# Patient Record
Sex: Female | Born: 1980 | Race: White | Hispanic: No | Marital: Married | Smoking: Never smoker
Health system: Southern US, Community
[De-identification: ages and names within clinical notes are randomized; demographics above are authoritative.]

## PROBLEM LIST (undated history)

## (undated) DIAGNOSIS — G43909 Migraine, unspecified, not intractable, without status migrainosus: Secondary | ICD-10-CM

## (undated) DIAGNOSIS — F329 Major depressive disorder, single episode, unspecified: Secondary | ICD-10-CM

## (undated) DIAGNOSIS — F32A Depression, unspecified: Secondary | ICD-10-CM

---

## 2018-02-28 ENCOUNTER — Encounter: Payer: Self-pay | Admitting: Emergency Medicine

## 2018-02-28 ENCOUNTER — Emergency Department (INDEPENDENT_AMBULATORY_CARE_PROVIDER_SITE_OTHER)
Admission: EM | Admit: 2018-02-28 | Discharge: 2018-02-28 | Disposition: A | Discharge status: Home / Self Care | Payer: 59 | Source: Home / Self Care | Attending: Family Medicine | Admitting: Family Medicine

## 2018-02-28 DIAGNOSIS — M542 Cervicalgia: Secondary | ICD-10-CM

## 2018-02-28 DIAGNOSIS — G43909 Migraine, unspecified, not intractable, without status migrainosus: Secondary | ICD-10-CM

## 2018-02-28 HISTORY — DX: Migraine, unspecified, not intractable, without status migrainosus: G43.909

## 2018-02-28 HISTORY — DX: Major depressive disorder, single episode, unspecified: F32.9

## 2018-02-28 HISTORY — DX: Depression, unspecified: F32.A

## 2018-02-28 MED ORDER — DEXAMETHASONE SODIUM PHOSPHATE 10 MG/ML IJ SOLN
10.0000 mg | Freq: Once | INTRAMUSCULAR | Status: AC
  Administered 2018-02-28: 10 mg via INTRAMUSCULAR

## 2018-02-28 MED ORDER — METOCLOPRAMIDE HCL 5 MG/ML IJ SOLN
5.0000 mg | Freq: Once | INTRAMUSCULAR | Status: AC
  Administered 2018-02-28: 5 mg via INTRAMUSCULAR

## 2018-02-28 MED ORDER — KETOROLAC TROMETHAMINE 60 MG/2ML IM SOLN
60.0000 mg | Freq: Once | INTRAMUSCULAR | Status: AC
  Administered 2018-02-28: 60 mg via INTRAMUSCULAR

## 2018-02-28 NOTE — ED Provider Notes (Signed)
[facility]    CSN: [id number] Arrival date & time: 02/28/18  1651     History   Chief Complaint Chief Complaint  Patient presents with  . Neck Injury    HPI Julia Benjamin is a 37 y.o. female.   Patient has a history of chronic neck pain, and is scheduled to see her neurologist in one week.  She also has a history of migraine headaches. She developed recurrent posterior neck pain two days ago that triggered a typical migraine headache.  Her headache has not responded to Topamax and Goody's powders.    The history is provided by the patient and the spouse.  Migraine  This is a recurrent problem. The current episode started 2 days ago. The problem occurs constantly. The problem has been gradually worsening. Associated symptoms include headaches. Exacerbated by: bright light. Nothing relieves the symptoms. Treatments tried: Topamax and Goody's powders. The treatment provided no relief.    Past Medical History:  Diagnosis Date  . Depression   . Migraine     There are no active problems to display for this patient.   History reviewed. No pertinent surgical history.  OB History   None      Home Medications    Prior to Admission medications   Medication Sig Start Date End Date Taking? Authorizing Provider  escitalopram (LEXAPRO) 20 MG tablet Take 20 mg by mouth daily.   Yes [provider]  topiramate (TOPAMAX) 25 MG capsule Take 25 mg by mouth 2 (two) times daily.   Yes [provider]    Family History History reviewed. No pertinent family history.  Social History Social History   Tobacco Use  . Smoking status: Never Smoker  . Smokeless tobacco: Never Used  Substance Use Topics  . Alcohol use: Yes  . Drug use: Not Currently     Allergies   Patient has no allergy information on record.   Review of Systems Review of Systems  Constitutional: Positive for activity change and appetite change. Negative for chills,  diaphoresis, fatigue and fever.  HENT: Negative for congestion, facial swelling, rhinorrhea and sinus pressure.   Eyes: Positive for photophobia.  Respiratory: Negative.   Cardiovascular: Negative.   Gastrointestinal: Positive for nausea.  Genitourinary: Negative.   Musculoskeletal: Positive for neck pain.  Skin: Negative.   Neurological: Positive for headaches. Negative for tremors, syncope, facial asymmetry, speech difficulty, weakness and numbness.     Physical Exam Triage Vital Signs ED Triage Vitals  Enc Vitals Group     BP 02/28/18 1722 101/66     Pulse Rate 02/28/18 1722 81     Resp --      Temp 02/28/18 1722 97.6 F (36.4 C)     Temp Source 02/28/18 1722 Oral     SpO2 02/28/18 1722 96 %     Weight 02/28/18 1723 190 lb (86.2 kg)     Height --      Head Circumference --      Peak Flow --      Pain Score 02/28/18 1723 10     Pain Loc --      Pain Edu? --      Excl. in [place]? --    No data found.  Updated Vital Signs BP 101/66 (BP Location: Right Arm)   Pulse 81   Temp 97.6 F (36.4 C) (Oral)   Wt 86.2 kg   LMP 02/14/2018 (Approximate)   SpO2 96%   Visual Acuity Right Eye Distance:  Left Eye Distance:   Bilateral Distance:    Right Eye Near:   Left Eye Near:    Bilateral Near:     Physical Exam Nursing notes and Vital Signs reviewed. Appearance:  Patient is sitting in darkened room.  She appears uncomfortable but in no acute distress Eyes:  Pupils are equal, round, and reactive to light and accomodation.  Extraocular movement is intact.  Conjunctivae are not inflamed.  Fundi benign.  Mild photophobia present.  Ears:  Canals normal.  Tympanic membranes normal.  Nose:   Normal turbinates.  No sinus tenderness.   Pharynx:  Normal Neck:  Supple.  No adenopathy. Posterior trapezius muscle tenderness, extending to occipital area.  Lungs:  Clear to auscultation.  Breath sounds are equal.  Moving air well. Heart:  Regular rate and rhythm without murmurs, rubs,  or gallops.  Abdomen:  Nontender without masses or hepatosplenomegaly.  Bowel sounds are present.  No CVA or flank tenderness.  Extremities:  No edema.  Skin:  No rash present.  Neurologic:  Cranial nerves 2 through 12 are normal.  Patellar, achilles, and elbow reflexes are normal.  Cerebellar function is intact (finger-to-nose and rapid alternating hand movement).  Gait and station are normal.  UC Treatments / Results  Labs (all labs ordered are listed, but only abnormal results are displayed) Labs Reviewed - No data to display  EKG None  Radiology No results found.  Procedures Procedures (including critical care time)  Medications Ordered in UC Medications  ketorolac (TORADOL) injection 60 mg (has no administration in time range)  metoCLOPramide (REGLAN) injection 5 mg (has no administration in time range)  dexamethasone (DECADRON) injection 10 mg (has no administration in time range)    Initial Impression / Assessment and Plan / UC Course  I have reviewed the triage vital signs and the nursing notes.  Pertinent labs & imaging results that were available during my care of the patient were reviewed by me and considered in my medical decision making (see chart for details).    Administered Toradol 60mg  IM Administered Reglan 5mg  IM.  Administered Decadron 10mg  IM.  Followup with neurologist as scheduled   Final Clinical Impressions(s) / UC Diagnoses   Final diagnoses:  Migraine without status migrainosus, not intractable, unspecified migraine type  Neck pain, acute     Discharge Instructions     If symptoms become significantly worse during the night or over the weekend, proceed to the local emergency room.     ED Prescriptions    None        Julia Haw, MD 03/02/18 [phone]

## 2018-02-28 NOTE — Discharge Instructions (Addendum)
If symptoms become significantly worse during the night or over the weekend, proceed to the local emergency room.  

## 2018-02-28 NOTE — ED Triage Notes (Signed)
Pt c/o neck pain and migraine x2 days. States she took topamax and goody powders with no relief.

## 2019-05-13 ENCOUNTER — Encounter: Payer: Self-pay | Admitting: Emergency Medicine

## 2019-05-13 ENCOUNTER — Emergency Department (INDEPENDENT_AMBULATORY_CARE_PROVIDER_SITE_OTHER): Payer: 59

## 2019-05-13 ENCOUNTER — Emergency Department (INDEPENDENT_AMBULATORY_CARE_PROVIDER_SITE_OTHER)
Admission: EM | Admit: 2019-05-13 | Discharge: 2019-05-13 | Disposition: A | Discharge status: Home / Self Care | Payer: 59 | Source: Home / Self Care

## 2019-05-13 ENCOUNTER — Other Ambulatory Visit: Payer: Self-pay

## 2019-05-13 DIAGNOSIS — M25511 Pain in right shoulder: Secondary | ICD-10-CM

## 2019-05-13 DIAGNOSIS — S43421A Sprain of right rotator cuff capsule, initial encounter: Secondary | ICD-10-CM | POA: Diagnosis not present

## 2019-05-13 MED ORDER — HYDROCODONE-ACETAMINOPHEN 5-325 MG PO TABS: ORAL_TABLET | ORAL | 0 refills | Status: AC

## 2019-05-13 NOTE — ED Triage Notes (Addendum)
Patient reports falling down about 6 indoor steps yesterday and catching her right shoulder on railing; has her arm in sling; took ibuprofen 600mg  at 1300.She can move her hand, lower and upper arm with all pain originating in shoulder.  She has no known exposure to covid positive person, nor has she travelled.

## 2019-05-13 NOTE — ED Provider Notes (Signed)
[facility]    CSN: [id number] Arrival date & time: 05/13/19  1407      History   Chief Complaint Chief Complaint  Patient presents with  . Shoulder Pain    HPI Julia Benjamin is a 38 y.o. female.   Patient fell down stairs yesterday.  Her right arm caught on the handrail, resulting in hyper-abduction of her right shoulder.  She has had persistent right shoulder pain, and is unable to abduct her shoulder.  The history is provided by the patient.  Shoulder Injury This is a new problem. The current episode started yesterday. The problem occurs constantly. The problem has not changed since onset.Pertinent negatives include no chest pain. Exacerbated by: right shoulder movement. Nothing relieves the symptoms. Treatments tried: Ibuprofen. The treatment provided no relief.    Past Medical History:  Diagnosis Date  . Depression   . Migraine     There are no active problems to display for this patient.   History reviewed. No pertinent surgical history.  OB History   No obstetric history on file.      Home Medications    Prior to Admission medications   Medication Sig Start Date End Date Taking? Authorizing Provider  escitalopram (LEXAPRO) 20 MG tablet Take 20 mg by mouth daily.    [provider]  HYDROcodone-acetaminophen (NORCO/VICODIN) 5-325 MG tablet Take one by mouth at bedtime as needed for pain 05/13/19   Kandra Nicolas, MD  topiramate (TOPAMAX) 25 MG capsule Take 25 mg by mouth 2 (two) times daily.    [provider]    Family History No family history on file.  Social History Social History   Tobacco Use  . Smoking status: Never Smoker  . Smokeless tobacco: Never Used  Substance Use Topics  . Alcohol use: Yes  . Drug use: Not Currently     Allergies   Patient has no known allergies.   Review of Systems Review of Systems  Cardiovascular: Negative for chest pain.  All other systems reviewed and are negative.     Physical Exam Triage Vital Signs ED Triage Vitals  Enc Vitals Group     BP 05/13/19 1507 (!) 95/59     Pulse Rate 05/13/19 1507 (!) 55     Resp 05/13/19 1507 16     Temp 05/13/19 1507 97.8 F (36.6 C)     Temp Source 05/13/19 1507 Oral     SpO2 05/13/19 1507 96 %     Weight 05/13/19 1508 200 lb (90.7 kg)     Height 05/13/19 1508 5\' 2"  (1.575 m)     Head Circumference --      Peak Flow --      Pain Score 05/13/19 1508 7     Pain Loc --      Pain Edu? --      Excl. in [place]? --    No data found.  Updated Vital Signs BP (!) 95/59 (BP Location: Left Arm)   Pulse (!) 55   Temp 97.8 F (36.6 C) (Oral)   Resp 16   Ht 5\' 2"  (1.575 m)   Wt 90.7 kg   LMP 04/25/2019   SpO2 96%   BMI 36.58 kg/m   Visual Acuity Right Eye Distance:   Left Eye Distance:   Bilateral Distance:    Right Eye Near:   Left Eye Near:    Bilateral Near:     Physical Exam Constitutional:      General: She  is not in acute distress.    Appearance: She is obese.  HENT:     Head: Normocephalic.  Eyes:     Pupils: Pupils are equal, round, and reactive to light.  Cardiovascular:     Heart sounds: Normal heart sounds.  Pulmonary:     Breath sounds: Normal breath sounds.  Musculoskeletal:     Right shoulder: No swelling, deformity, laceration, tenderness, bony tenderness or crepitus. Decreased range of motion. Decreased strength. Normal pulse.     Cervical back: Normal range of motion.     Comments: Right shoulder has decreased internal/external rotation but relatively good strength.  The patient has difficulty actively/passively abducting more than 45 degrees from vertical. Apley's test positive.  Patient unable to perform empty can test.  Distal neurovascular function is intact.    Skin:    General: Skin is warm and dry.  Neurological:     Mental Status: She is alert.      UC Treatments / Results  Labs (all labs ordered are listed, but only abnormal results are displayed) Labs Reviewed - No  data to display  EKG   Radiology Dg Shoulder Right  Result Date: 05/13/2019 CLINICAL DATA:  38 year old female with fall and right shoulder pain. EXAM: RIGHT SHOULDER - 2+ VIEW COMPARISON:  None. FINDINGS: There is no evidence of fracture or dislocation. There is no evidence of arthropathy or other focal bone abnormality. Soft tissues are unremarkable. IMPRESSION: Negative. Electronically Signed   By: Elgie Collard M.D.   On: 05/13/2019 15:43    Procedures Procedures (including critical care time)  Medications Ordered in UC Medications - No data to display  Initial Impression / Assessment and Plan / UC Course  I have reviewed the triage vital signs and the nursing notes.  Pertinent labs & imaging results that were available during my care of the patient were reviewed by me and considered in my medical decision making (see chart for details).    Rx for Lortab at bedtime (#5, no refill). Controlled Substance Prescriptions I have consulted the [place] Controlled Substances Registry for this patient, and feel the risk/benefit ratio today is favorable for proceeding with this prescription for a controlled substance.  Schedule follow-up with Sports Medicine or Orhtopedist.   Final Clinical Impressions(s) / UC Diagnoses   Final diagnoses:  Sprain of right rotator cuff capsule, initial encounter     Discharge Instructions     Wear sling.  Apply ice pack for 20 to 30 minutes, 3 to 4 times daily  Continue until pain and swelling decrease.  May take Aleve, 2 tabs every 12 hours. Begin shoulder exercises as tolerated.   ED Prescriptions    Medication Sig Dispense Auth. Provider   HYDROcodone-acetaminophen (NORCO/VICODIN) 5-325 MG tablet Take one by mouth at bedtime as needed for pain 5 tablet Julia Haw, MD        Julia Haw, MD 05/18/19 [phone]

## 2019-05-13 NOTE — Discharge Instructions (Addendum)
Wear sling.  Apply ice pack for 20 to 30 minutes, 3 to 4 times daily  Continue until pain and swelling decrease.  May take Aleve, 2 tabs every 12 hours. Begin shoulder exercises as tolerated.

## 2019-08-16 ENCOUNTER — Ambulatory Visit: Payer: 59 | Attending: Internal Medicine

## 2019-08-16 DIAGNOSIS — Z23 Encounter for immunization: Secondary | ICD-10-CM

## 2019-08-16 NOTE — Progress Notes (Signed)
   Covid-19 Vaccination Clinic  Name:  Julia Benjamin    MRN: [id number] DOB: December 06, 1980  08/16/2019  Julia Benjamin was observed post Covid-19 immunization for 15 minutes without incident. She was provided with Vaccine Information Sheet and instruction to access the V-Safe system.   Julia Benjamin was instructed to call 911 with any severe reactions post vaccine: Marland Kitchen Difficulty breathing  . Swelling of face and throat  . A fast heartbeat  . A bad rash all over body  . Dizziness and weakness   Immunizations Administered    Name Date Dose VIS Date Route   Pfizer COVID-19 Vaccine 08/16/2019  8:30 AM 0.3 mL 05/18/2019 Intramuscular   Manufacturer: [employer], [employer]   Lot: [id number]   NDC: [address]-1

## 2019-09-10 ENCOUNTER — Ambulatory Visit: Payer: 59 | Attending: Internal Medicine

## 2019-09-10 DIAGNOSIS — Z23 Encounter for immunization: Secondary | ICD-10-CM

## 2019-09-10 NOTE — Progress Notes (Signed)
   Covid-19 Vaccination Clinic  Name:  Galia Rahm    MRN: [id number] DOB: [birth year]  09/10/2019  Ms. Vida was observed post Covid-19 immunization for 15 minutes without incident. She was provided with Vaccine Information Sheet and instruction to access the V-Safe system.   Ms. Robertshaw was instructed to call 911 with any severe reactions post vaccine: Marland Kitchen Difficulty breathing  . Swelling of face and throat  . A fast heartbeat  . A bad rash all over body  . Dizziness and weakness   Immunizations Administered    Name Date Dose VIS Date Route   Pfizer COVID-19 Vaccine 09/10/2019  8:32 AM 0.3 mL 05/18/2019 Intramuscular   Manufacturer: [employer], [employer]   Lot: [id number]   NDC: [address]-5

## 2020-05-20 IMAGING — DX DG SHOULDER 2+V*R*
3 series · 3 of 3 positions shown · non-contrast
Comparison: None.

CLINICAL DATA: 38-year-old female with fall and right shoulder
pain.

EXAM:
RIGHT SHOULDER - 2+ VIEW

[shoulder grashey]
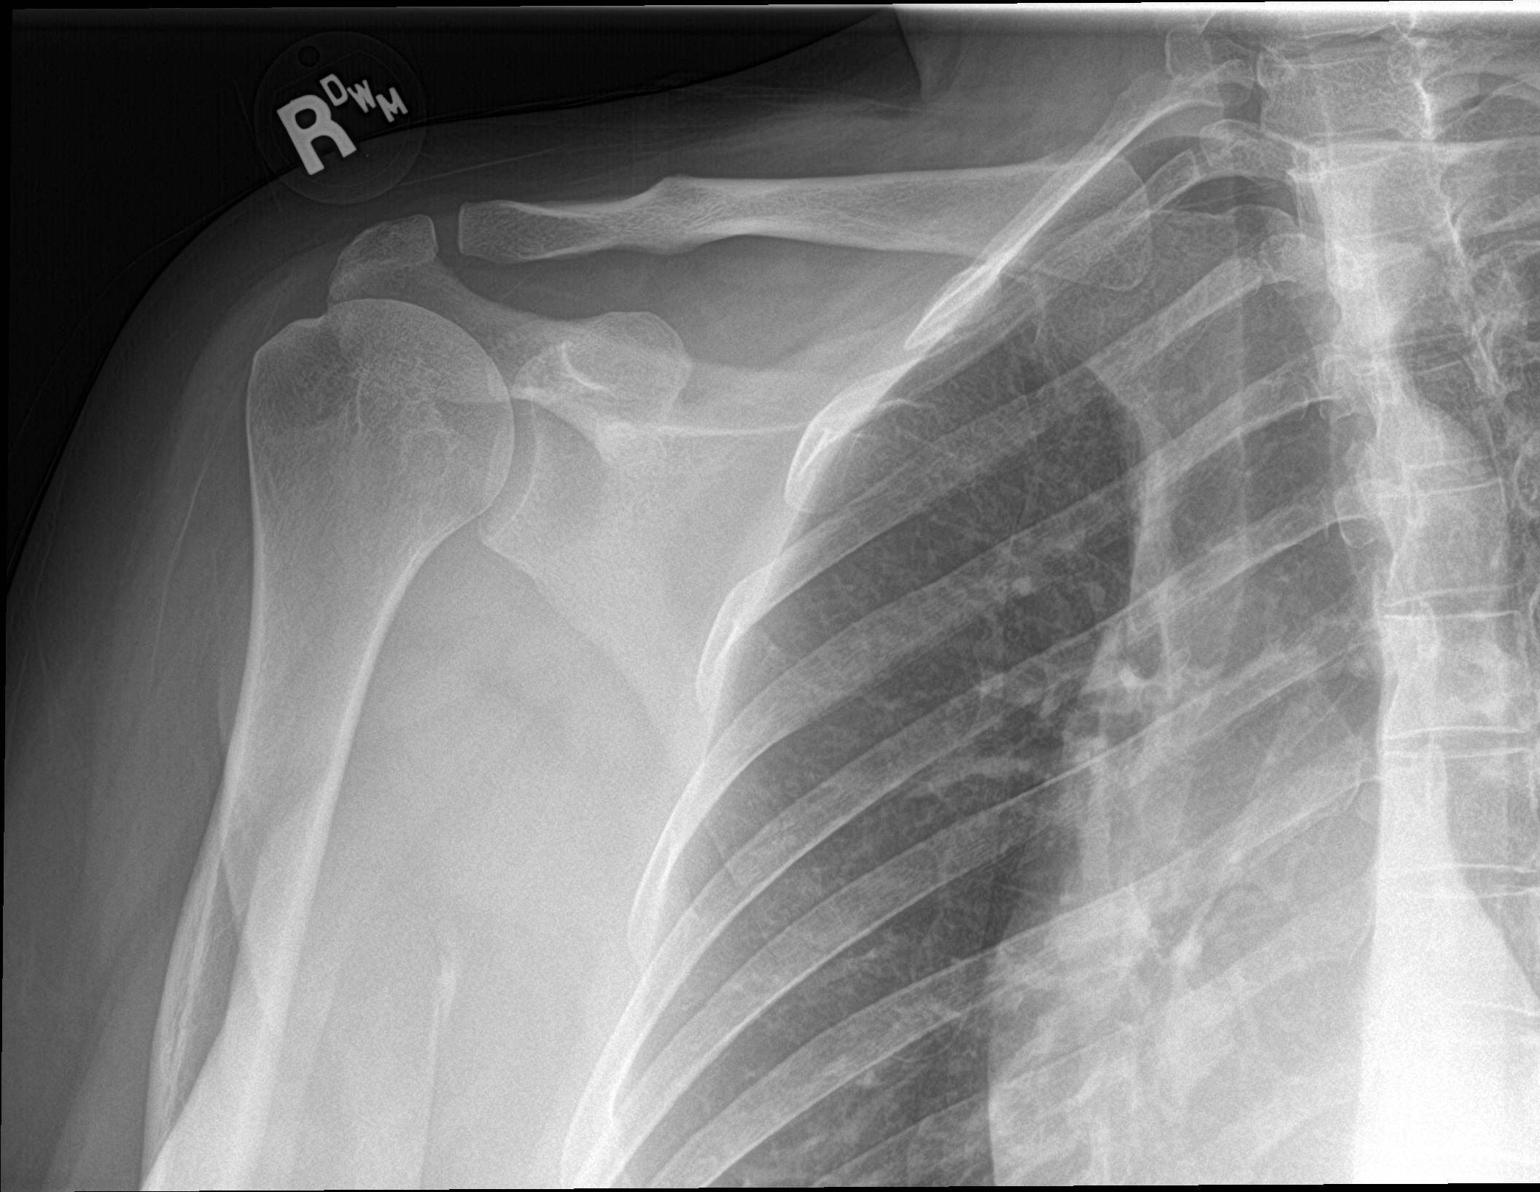

[shoulder axillary]
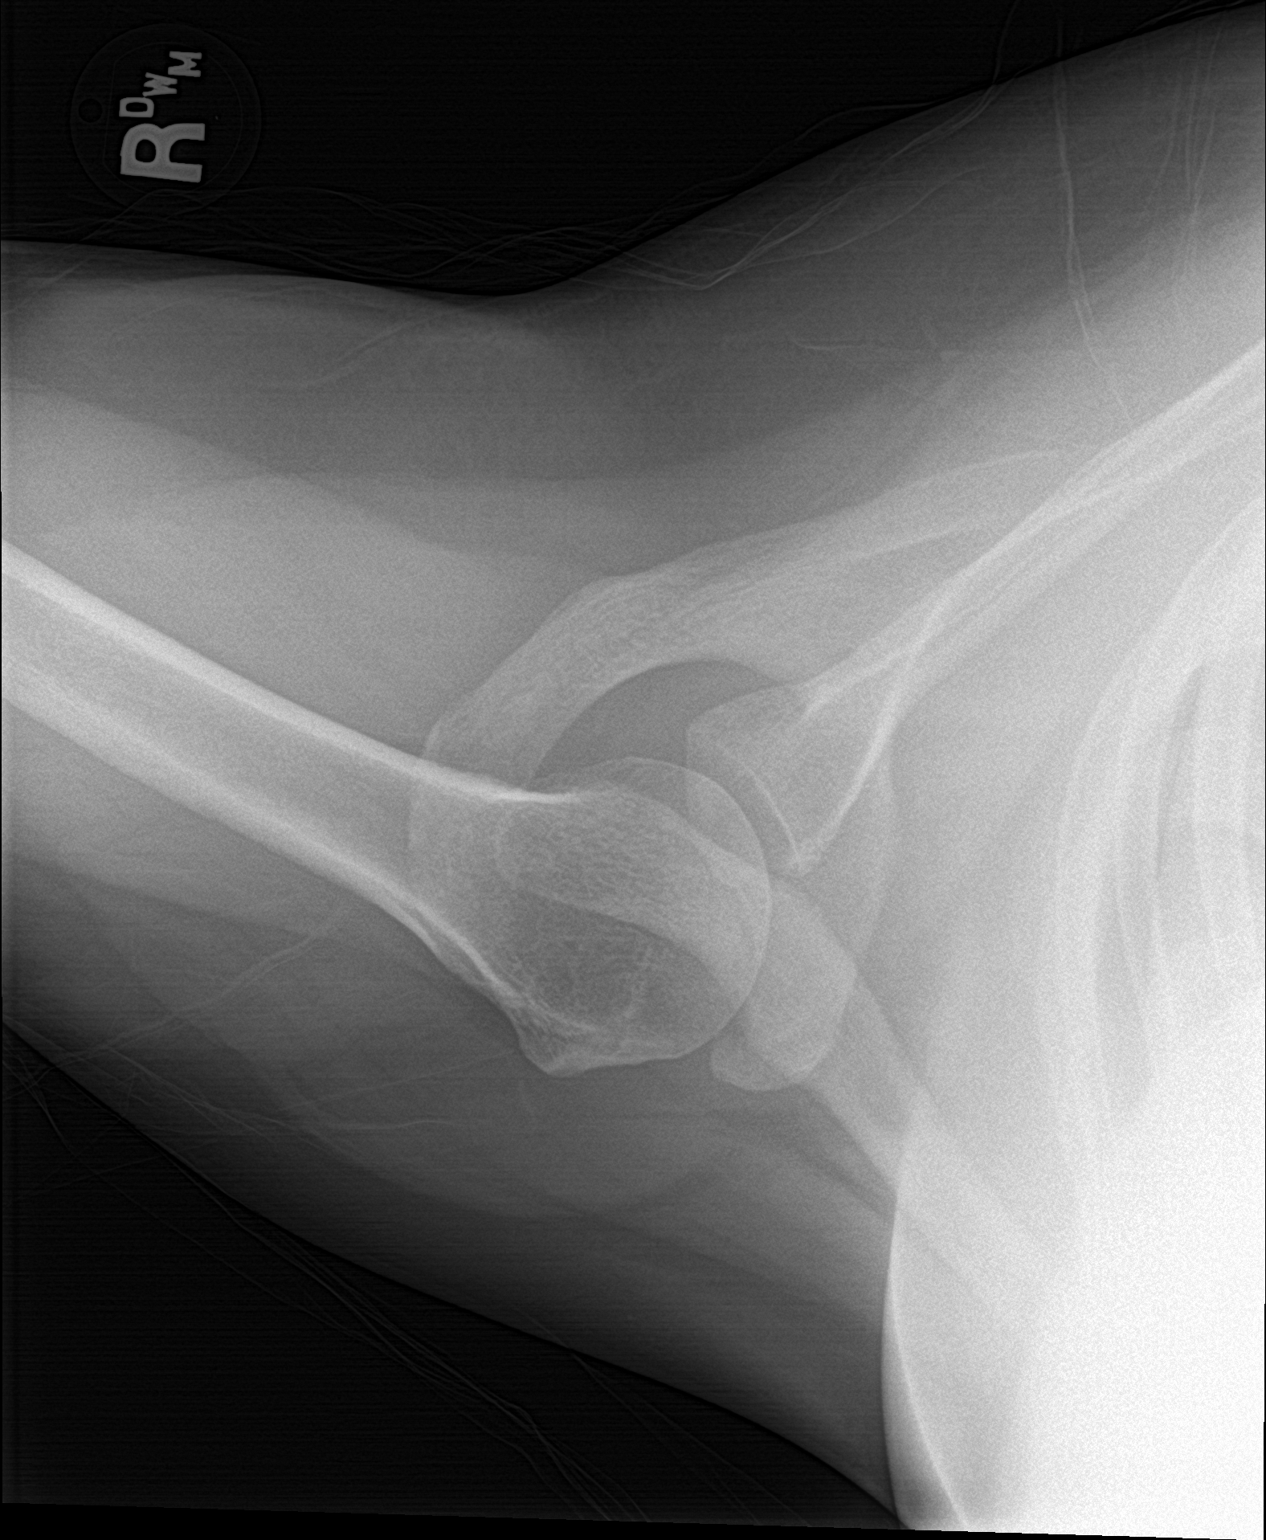

[shoulder y view]
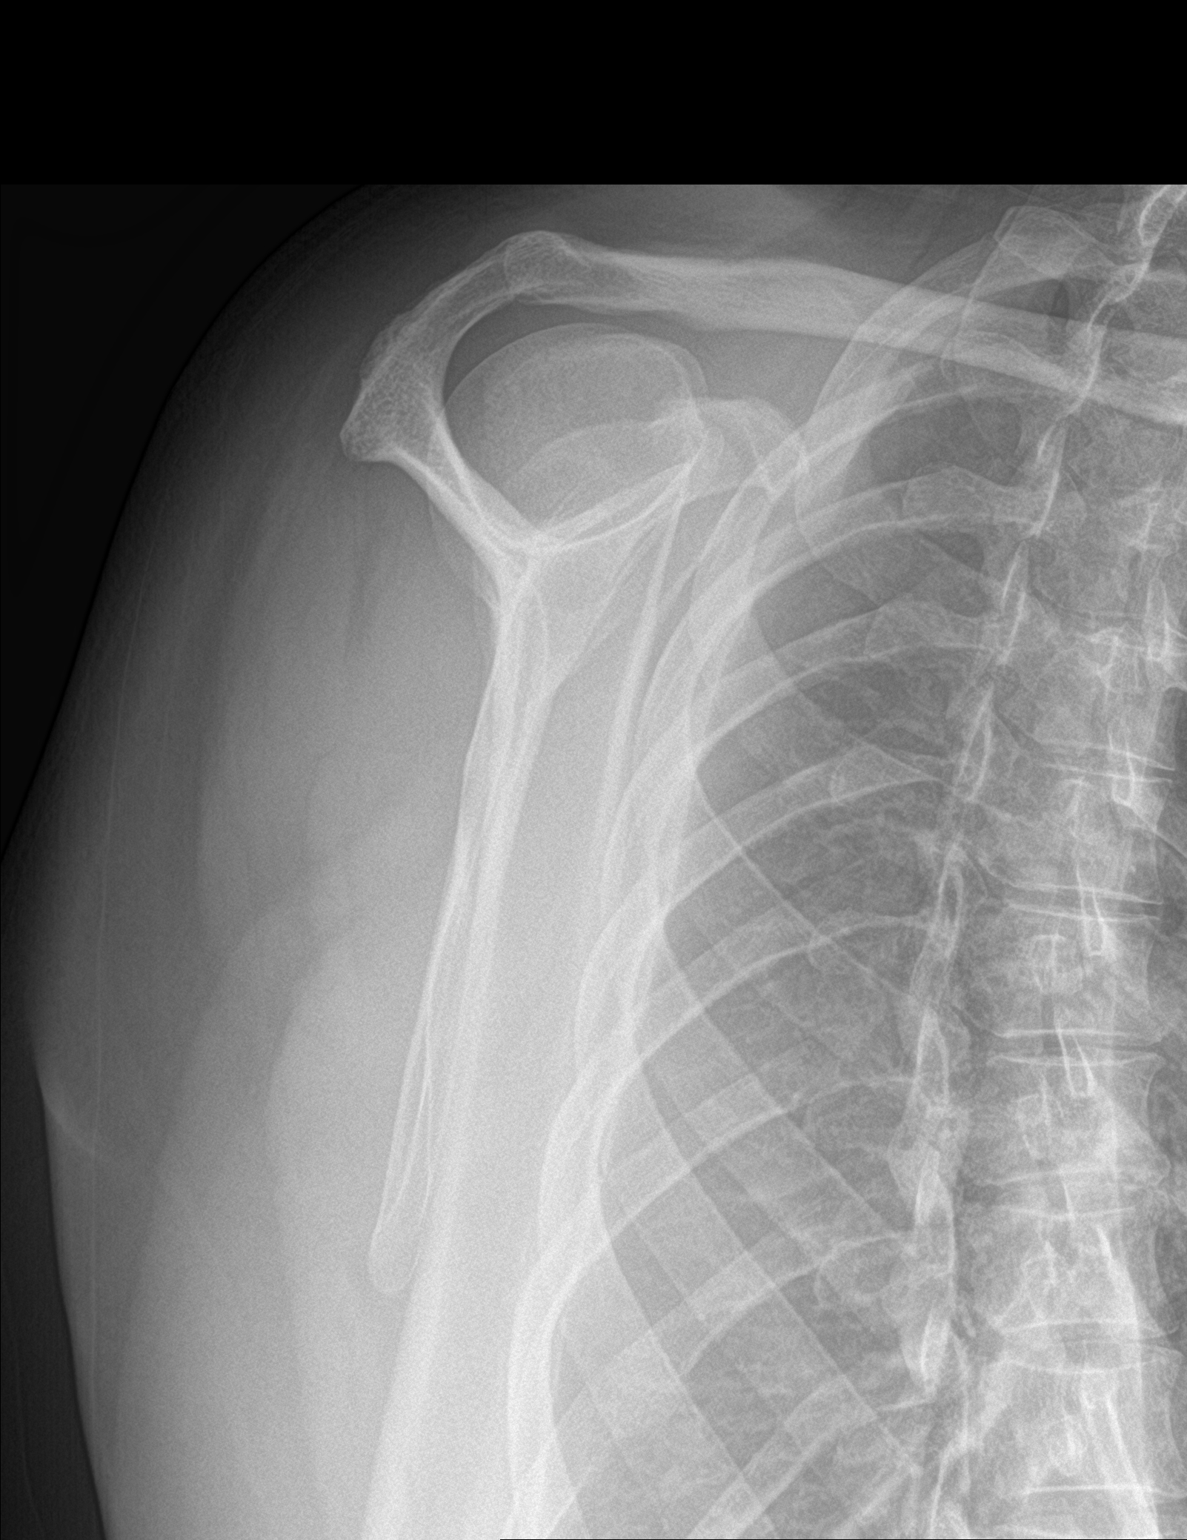

[3 of 3 positions shown; findings below may reference images not displayed]

FINDINGS: There is no evidence of fracture or dislocation. There is no
evidence of arthropathy or other focal bone abnormality. Soft
tissues are unremarkable.
IMPRESSION: Negative.
